# Patient Record
Sex: Female | Born: 2000 | Race: Black or African American | Hispanic: No | Marital: Single | Smoking: Never smoker
Health system: Southern US, Community
[De-identification: ages and names within clinical notes are randomized; demographics above are authoritative.]

## PROBLEM LIST (undated history)

## (undated) HISTORY — PX: TONSILLECTOMY: SUR1361

---
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
## 2019-09-24 ENCOUNTER — Other Ambulatory Visit: Payer: Self-pay

## 2019-09-24 ENCOUNTER — Ambulatory Visit
Admission: EM | Admit: 2019-09-24 | Discharge: 2019-09-24 | Disposition: A | Discharge status: Home / Self Care | Payer: BC Managed Care – PPO

## 2019-09-24 DIAGNOSIS — R51 Headache: Secondary | ICD-10-CM

## 2019-09-24 DIAGNOSIS — S39012A Strain of muscle, fascia and tendon of lower back, initial encounter: Secondary | ICD-10-CM | POA: Diagnosis not present

## 2019-09-24 MED ORDER — DEXAMETHASONE SODIUM PHOSPHATE 10 MG/ML IJ SOLN
10.0000 mg | Freq: Once | INTRAMUSCULAR | Status: AC
  Administered 2019-09-24: 10 mg via INTRAMUSCULAR

## 2019-09-24 MED ORDER — CYCLOBENZAPRINE HCL 5 MG PO TABS: 5.0000 mg | ORAL_TABLET | Freq: Two times a day (BID) | ORAL | 0 refills | Status: AC | PRN

## 2019-09-24 MED ORDER — KETOROLAC TROMETHAMINE 60 MG/2ML IM SOLN
60.0000 mg | Freq: Once | INTRAMUSCULAR | Status: AC
  Administered 2019-09-24: 60 mg via INTRAMUSCULAR

## 2019-09-24 NOTE — Discharge Instructions (Signed)
Take muscle relaxer as needed for severe pain, spasm. °May ice, rest, elevate the area(s) of pain.  You may also use hot compresses/warm wash rags to relieve muscle tightness. °May use OTC Tylenol, ibuprofen as needed for pain. °Return if you develop worsening pain, chest pain, difficulty breathing. °
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
## 2019-09-24 NOTE — ED Notes (Signed)
Patient able to ambulate independently  

## 2019-11-22 ENCOUNTER — Other Ambulatory Visit: Payer: Self-pay

## 2019-11-22 ENCOUNTER — Encounter (HOSPITAL_COMMUNITY): Payer: Self-pay

## 2019-11-22 ENCOUNTER — Ambulatory Visit (HOSPITAL_COMMUNITY)
Admission: EM | Admit: 2019-11-22 | Discharge: 2019-11-22 | Disposition: A | Discharge status: Home / Self Care | Payer: BC Managed Care – PPO | Attending: Family Medicine | Admitting: Family Medicine

## 2019-11-22 DIAGNOSIS — Z20822 Contact with and (suspected) exposure to covid-19: Secondary | ICD-10-CM

## 2019-11-22 DIAGNOSIS — J069 Acute upper respiratory infection, unspecified: Secondary | ICD-10-CM | POA: Insufficient documentation

## 2019-11-22 DIAGNOSIS — Z20828 Contact with and (suspected) exposure to other viral communicable diseases: Secondary | ICD-10-CM | POA: Diagnosis not present

## 2019-11-22 LAB — POC SARS CORONAVIRUS 2 AG -  ED: SARS Coronavirus 2 Ag: NEGATIVE

## 2019-11-22 LAB — POC SARS CORONAVIRUS 2 AG: SARS Coronavirus 2 Ag: NEGATIVE

## 2019-11-22 NOTE — ED Provider Notes (Signed)
[place]    CSN: [id number] Arrival date & time: 11/22/19  1741      History   Chief Complaint Chief Complaint  Patient presents with  . sore throat, runny nose, pos covid exp    HPI Lindsey Livingston is a 18 y.o. female.   Lindsey Livingston presents with complaints of bilateral ear pain which started 1 week ago. Throat itching, worse when she wakes up in the morning. A friend of her's tested positve for covid-19 last week, however, so she opted to get tested. Was around her friend last approximately 10 days ago. Runny nose, this has improved. No cough. No fevers. No headache. No body aches. No gi symptoms. Hasn't taken any medications for symptoms. Has had allergies in the past. History of tonsillectomy.    ROS per HPI, negative if not otherwise mentioned.      History reviewed. No pertinent past medical history.  There are no active problems to display for this patient.   Past Surgical History:  Procedure Laterality Date  . TONSILLECTOMY      OB History   No obstetric history on file.      Home Medications    Prior to Admission medications   Medication Sig Start Date End Date Taking? Authorizing Provider  cyclobenzaprine (FLEXERIL) 5 MG tablet Take 1 tablet (5 mg total) by mouth 2 (two) times daily as needed for muscle spasms. 09/24/19   Hall-Potvin, [place], PA-C  norgestimate-ethinyl estradiol (ORTHO-CYCLEN) 0.25-35 MG-MCG tablet Take 1 tablet by mouth daily.    [provider]    Family History Family History  Problem Relation Age of Onset  . Healthy Mother   . Healthy Father     Social History Social History   Tobacco Use  . Smoking status: Never Smoker  . Smokeless tobacco: Never Used  Substance Use Topics  . Alcohol use: No  . Drug use: No     Allergies   Penicillins   Review of Systems Review of Systems   Physical Exam Triage Vital Signs ED Triage Vitals  Enc Vitals Group     BP 11/22/19 1827 105/66     Pulse  Rate 11/22/19 1827 83     Resp 11/22/19 1827 16     Temp 11/22/19 1827 98.4 F (36.9 C)     Temp Source 11/22/19 1827 Oral     SpO2 11/22/19 1827 100 %     Weight --      Height --      Head Circumference --      Peak Flow --      Pain Score 11/22/19 1830 0     Pain Loc --      Pain Edu? --      Excl. in [place]? --    No data found.  Updated Vital Signs BP 105/66 (BP Location: Left Arm)   Pulse 83   Temp 98.4 F (36.9 C) (Oral)   Resp 16   SpO2 100%    Physical Exam Constitutional:      General: She is not in acute distress.    Appearance: She is well-developed.  HENT:     Right Ear: Tympanic membrane and ear canal normal.     Left Ear: Tympanic membrane and ear canal normal.     Mouth/Throat:     Mouth: Mucous membranes are moist.     Pharynx: No oropharyngeal exudate or posterior oropharyngeal erythema.  Cardiovascular:     Rate and Rhythm: Normal rate.  Pulmonary:     Effort: Pulmonary effort is normal.  Skin:    General: Skin is warm and dry.  Neurological:     Mental Status: She is alert and oriented to person, place, and time.      UC Treatments / Results  Labs (all labs ordered are listed, but only abnormal results are displayed) Labs Reviewed  NOVEL CORONAVIRUS, NAA (HOSP ORDER, SEND-OUT TO REF LAB; TAT 18-24 HRS)  POC SARS CORONAVIRUS 2 AG -  ED  POC SARS CORONAVIRUS 2 AG    EKG   Radiology No results found.  Procedures Procedures (including critical care time)  Medications Ordered in UC Medications - No data to display  Initial Impression / Assessment and Plan / UC Course  I have reviewed the triage vital signs and the nursing notes.  Pertinent labs & imaging results that were available during my care of the patient were reviewed by me and considered in my medical decision making (see chart for details).     Non toxic. Benign physical exam.  Afebrile. Rapid covid testing negative. Send out for confirmation collected and pending.  flonase recommended. Supportive cares recommended. Return precautions provided. Patient verbalized understanding and agreeable to plan.   Final Clinical Impressions(s) / UC Diagnoses   Final diagnoses:  Viral upper respiratory infection  Exposure to COVID-19 virus     Discharge Instructions     Your rapid covid test was negative today.  We will send out a secondary test to confirm this.  Self isolate until covid results are back and negative.  Will notify you by phone of any positive findings. Your negative results will be sent through your MyChart.     Push fluids to ensure adequate hydration and keep secretions thin.  Tylenol and/or ibuprofen as needed for pain or fevers.  Throat lozenges, gargles, chloraseptic spray, warm teas, popsicles etc to help with throat pain.      ED Prescriptions    None     PDMP not reviewed this encounter.   Georgetta Haber, NP 11/22/19 1948

## 2019-11-22 NOTE — Discharge Instructions (Signed)
Your rapid covid test was negative today.  We will send out a secondary test to confirm this.  Self isolate until covid results are back and negative.  Will notify you by phone of any positive findings. Your negative results will be sent through your MyChart.     Push fluids to ensure adequate hydration and keep secretions thin.  Tylenol and/or ibuprofen as needed for pain or fevers.  Throat lozenges, gargles, chloraseptic spray, warm teas, popsicles etc to help with throat pain.
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
## 2019-11-24 LAB — NOVEL CORONAVIRUS, NAA (HOSP ORDER, SEND-OUT TO REF LAB; TAT 18-24 HRS): SARS-CoV-2, NAA: NOT DETECTED
